# Patient Record
Sex: Male | Born: 1965 | Race: Black or African American | Hispanic: No | Marital: Married | Smoking: Current every day smoker
Health system: Southern US, Community
[De-identification: ages and names within clinical notes are randomized; demographics above are authoritative.]

---

## 2006-09-13 ENCOUNTER — Emergency Department (HOSPITAL_COMMUNITY): Admission: EM | Admit: 2006-09-13 | Discharge: 2006-09-13 | Payer: Self-pay | Admitting: Emergency Medicine

## 2007-04-16 ENCOUNTER — Emergency Department (HOSPITAL_COMMUNITY): Admission: EM | Admit: 2007-04-16 | Discharge: 2007-04-16 | Payer: Self-pay | Admitting: Emergency Medicine

## 2009-08-05 ENCOUNTER — Emergency Department (HOSPITAL_COMMUNITY): Admission: EM | Admit: 2009-08-05 | Discharge: 2009-08-05 | Payer: Self-pay | Admitting: Emergency Medicine

## 2009-08-10 ENCOUNTER — Emergency Department (HOSPITAL_COMMUNITY): Admission: EM | Admit: 2009-08-10 | Discharge: 2009-08-11 | Payer: Self-pay | Admitting: Emergency Medicine

## 2009-08-12 ENCOUNTER — Emergency Department (HOSPITAL_COMMUNITY): Admission: EM | Admit: 2009-08-12 | Discharge: 2009-08-12 | Payer: Self-pay | Admitting: Emergency Medicine

## 2010-09-28 LAB — COMPREHENSIVE METABOLIC PANEL
AST: 27 U/L (ref 0–37)
Albumin: 4.3 g/dL (ref 3.5–5.2)
Albumin: 4 g/dL (ref 3.5–5.2)
Alkaline Phosphatase: 59 U/L (ref 39–117)
Alkaline Phosphatase: 66 U/L (ref 39–117)
Calcium: 8.9 mg/dL (ref 8.4–10.5)
Creatinine, Ser: 1.19 mg/dL (ref 0.4–1.5)
GFR calc Af Amer: >60 mL/min (ref >60)
Glucose, Bld: 123 mg/dL — ABNORMAL HIGH (ref 70–99)
Glucose, Bld: 116 mg/dL — ABNORMAL HIGH (ref 70–99)
Potassium: 3.4 mEq/L — ABNORMAL LOW (ref 3.5–5.1)
Sodium: 136 mEq/L (ref 135–145)
Sodium: 133 mEq/L — ABNORMAL LOW (ref 135–145)
Total Bilirubin: 0.8 mg/dL (ref 0.3–1.2)
Total Protein: 8.4 g/dL — ABNORMAL HIGH (ref 6.0–8.3)

## 2010-09-28 LAB — CBC
HCT: 44.1 % (ref 39.0–52.0)
Hemoglobin: 14.7 g/dL (ref 13.0–17.0)
Hemoglobin: 14.5 g/dL (ref 13.0–17.0)
MCHC: 33.3 g/dL (ref 30.0–36.0)
MCHC: 32.8 g/dL (ref 30.0–36.0)
MCV: 86 fL (ref 78.0–100.0)
RBC: 5.13 MIL/uL (ref 4.22–5.81)
RBC: 5.15 MIL/uL (ref 4.22–5.81)
RDW: 13.1 % (ref 11.5–15.5)
RDW: 14.3 % (ref 11.5–15.5)
WBC: 18.6 10*3/uL — ABNORMAL HIGH (ref 4.0–10.5)

## 2010-09-28 LAB — DIFFERENTIAL
Basophils Absolute: 0.1 10*3/uL (ref 0.0–0.1)
Basophils Relative: 3 % — ABNORMAL HIGH (ref 0–1)
Basophils Relative: 1 % (ref 0–1)
Eosinophils Absolute: 0 10*3/uL (ref 0.0–0.7)
Eosinophils Absolute: 0.1 10*3/uL (ref 0.0–0.7)
Monocytes Absolute: 1.1 10*3/uL — ABNORMAL HIGH (ref 0.1–1.0)
Monocytes Absolute: 1.2 10*3/uL — ABNORMAL HIGH (ref 0.1–1.0)
Monocytes Relative: 6 % (ref 3–12)
Monocytes Relative: 6 % (ref 3–12)
Neutro Abs: 12.9 10*3/uL — ABNORMAL HIGH (ref 1.7–7.7)
Neutrophils Relative %: 70 % (ref 43–77)

## 2010-09-28 LAB — URINALYSIS, ROUTINE W REFLEX MICROSCOPIC
Leukocytes, UA: NEGATIVE
Nitrite: NEGATIVE
Protein, ur: 30 mg/dL — AB
Urobilinogen, UA: 1 mg/dL (ref 0.0–1.0)

## 2010-09-28 LAB — URINE MICROSCOPIC-ADD ON

## 2010-09-28 LAB — LIPASE, BLOOD: Lipase: 16 U/L (ref 11–59)

## 2010-10-01 LAB — URINALYSIS, ROUTINE W REFLEX MICROSCOPIC
Glucose, UA: NEGATIVE mg/dL
Hgb urine dipstick: NEGATIVE
Specific Gravity, Urine: 1.028 (ref 1.005–1.030)
Urobilinogen, UA: 1 mg/dL (ref 0.0–1.0)

## 2010-10-01 LAB — COMPREHENSIVE METABOLIC PANEL
ALT: 27 U/L (ref 0–53)
Alkaline Phosphatase: 59 U/L (ref 39–117)
BUN: 14 mg/dL (ref 6–23)
CO2: 21 mEq/L (ref 19–32)
GFR calc Af Amer: >60 mL/min (ref >60)
GFR calc non Af Amer: >60 mL/min (ref >60)
Glucose, Bld: 142 mg/dL — ABNORMAL HIGH (ref 70–99)
Potassium: 3.5 mEq/L (ref 3.5–5.1)
Sodium: 135 mEq/L (ref 135–145)
Total Bilirubin: 0.8 mg/dL (ref 0.3–1.2)
Total Protein: 7.7 g/dL (ref 6.0–8.3)

## 2010-10-01 LAB — CBC
HCT: 40.8 % (ref 39.0–52.0)
Platelets: 341 10*3/uL (ref 150–400)
RBC: 4.77 MIL/uL (ref 4.22–5.81)

## 2010-10-01 LAB — DIFFERENTIAL
Basophils Absolute: 0 10*3/uL (ref 0.0–0.1)
Eosinophils Relative: 1 % (ref 0–5)
Lymphocytes Relative: 21 % (ref 12–46)
Lymphs Abs: 3.2 10*3/uL (ref 0.7–4.0)
Neutro Abs: 10.8 10*3/uL — ABNORMAL HIGH (ref 1.7–7.7)
Neutrophils Relative %: 72 % (ref 43–77)

## 2010-10-01 LAB — LIPASE, BLOOD: Lipase: 19 U/L (ref 11–59)

## 2011-08-18 ENCOUNTER — Encounter (HOSPITAL_COMMUNITY): Payer: Self-pay | Admitting: *Deleted

## 2011-08-18 ENCOUNTER — Emergency Department (HOSPITAL_COMMUNITY)
Admission: EM | Admit: 2011-08-18 | Discharge: 2011-08-18 | Disposition: A | Discharge status: Home / Self Care | Payer: Self-pay | Attending: Emergency Medicine | Admitting: Emergency Medicine

## 2011-08-18 DIAGNOSIS — R209 Unspecified disturbances of skin sensation: Secondary | ICD-10-CM | POA: Insufficient documentation

## 2011-08-18 DIAGNOSIS — M79609 Pain in unspecified limb: Secondary | ICD-10-CM | POA: Insufficient documentation

## 2011-08-18 DIAGNOSIS — M5432 Sciatica, left side: Secondary | ICD-10-CM

## 2011-08-18 DIAGNOSIS — M5430 Sciatica, unspecified side: Secondary | ICD-10-CM | POA: Insufficient documentation

## 2011-08-18 MED ORDER — OXYCODONE-ACETAMINOPHEN 5-325 MG PO TABS
2.0000 | ORAL_TABLET | Freq: Once | ORAL | Status: AC
  Administered 2011-08-18: 2 via ORAL
  Filled 2011-08-18: qty 2

## 2011-08-18 MED ORDER — OXYCODONE-ACETAMINOPHEN 5-325 MG PO TABS
2.0000 | ORAL_TABLET | ORAL | Status: AC | PRN
Start: 2011-08-18 — End: 2011-08-28

## 2011-08-18 MED ORDER — PREDNISONE 20 MG PO TABS: ORAL_TABLET | ORAL | Status: AC

## 2011-08-18 MED ORDER — CYCLOBENZAPRINE HCL 5 MG PO TABS: 10.0000 mg | ORAL_TABLET | Freq: Three times a day (TID) | ORAL | Status: AC | PRN

## 2011-08-18 MED ORDER — PREDNISONE 20 MG PO TABS
60.0000 mg | ORAL_TABLET | Freq: Once | ORAL | Status: AC
  Administered 2011-08-18: 60 mg via ORAL
  Filled 2011-08-18: qty 3

## 2011-08-18 NOTE — ED Notes (Signed)
Pt in c/o right thigh pain x3 weeks, worse over last few days, unsure of injury

## 2011-08-18 NOTE — ED Provider Notes (Signed)
History     CSN: [id number]  Arrival date & time 08/18/11  1904   First MD Initiated Contact with Patient 08/18/11 1911      Chief Complaint  Patient presents with  . Leg Pain    (Consider location/radiation/quality/duration/timing/severity/associated sxs/prior treatment) HPI  46yo M c/o L thigh pain.  Pain has been wax and wane for the past few weeks.  Sts he initially was walking and felt a mild burning sensation to back of L thigh.  Pain then resolved on its own.  However, for the past 3 days he notice significant pain to L thigh.  Pain worsen with leg movement, and improves with laying in a certain position.  He denies specific injury, fever, rash, back pain, knee pain.  Denies urinary or bowel incontinence, caudal equina sxs, or pain radiates past knee.  Denies hx of diabetes or IV drug use.    History reviewed. No pertinent past medical history.  History reviewed. No pertinent past surgical history.  History reviewed. No pertinent family history.  History  Substance Use Topics  . Smoking status: Not on file  . Smokeless tobacco: Not on file  . Alcohol Use: Not on file      Review of Systems  All other systems reviewed and are negative.    Allergies  Review of patient's allergies indicates no known allergies.  Home Medications  No current outpatient prescriptions on file.  BP 159/83  Pulse 101  Temp(Src) 98 F (36.7 C) (Oral)  Resp 20  SpO2 100%  Physical Exam  Nursing note and vitals reviewed. Constitutional: He appears well-developed and well-nourished. No distress.  HENT:  Head: Atraumatic.  Eyes: Conjunctivae are normal.  Neck: Neck supple.  Cardiovascular: Normal rate and regular rhythm.   Pulmonary/Chest: Effort normal. No respiratory distress. He exhibits no tenderness.  Abdominal: Soft.  Musculoskeletal:       Right hip: Normal.       Left hip: He exhibits decreased range of motion and decreased strength. He exhibits no tenderness and no  bony tenderness.       Right knee: Normal.       Left knee: Normal.       Cervical back: Normal.       Thoracic back: Normal.       Lumbar back: Normal.       L thigh, increase pain with flexion, leg raise, or leg extension.  Pain to posterior aspect of thigh.  No overlying skin changes, nor swelling noted  Patella DTR 2+ bilaterally.      ED Course  Procedures (including critical care time)  Labs Reviewed - No data to display No results found.   No diagnosis found.    MDM  Pain to L posterior thigh.  PERC negative, doubt DVT, likely L sciatica.  No complaints concerning for spinal nerve impingement at this time.  Will d/c with percocet/flexeril/prednisone and f/u instruction with ortho.  Pt voice understanding.          Fayrene Helper, PA-C 08/18/11 1947

## 2011-08-18 NOTE — ED Notes (Signed)
Pt is c/o pain in the back of his left thigh  States it started a few weeks ago and has progressively gotten worse  States pain has been severe since Monday  Denies injury

## 2011-08-18 NOTE — ED Provider Notes (Signed)
Medical screening examination/treatment/procedure(s) were performed by non-physician practitioner and as supervising physician I was immediately available for consultation/collaboration.   Dayton Bailiff, MD 08/18/11 2017

## 2017-09-07 ENCOUNTER — Encounter (HOSPITAL_COMMUNITY): Payer: Self-pay

## 2017-09-07 ENCOUNTER — Emergency Department (HOSPITAL_COMMUNITY)
Admission: EM | Admit: 2017-09-07 | Discharge: 2017-09-07 | Disposition: A | Discharge status: Home / Self Care | Payer: Self-pay | Attending: Emergency Medicine | Admitting: Emergency Medicine

## 2017-09-07 DIAGNOSIS — X500XXA Overexertion from strenuous movement or load, initial encounter: Secondary | ICD-10-CM | POA: Insufficient documentation

## 2017-09-07 DIAGNOSIS — M5416 Radiculopathy, lumbar region: Secondary | ICD-10-CM | POA: Insufficient documentation

## 2017-09-07 DIAGNOSIS — Y999 Unspecified external cause status: Secondary | ICD-10-CM | POA: Insufficient documentation

## 2017-09-07 DIAGNOSIS — Y93H9 Activity, other involving exterior property and land maintenance, building and construction: Secondary | ICD-10-CM | POA: Insufficient documentation

## 2017-09-07 DIAGNOSIS — Y9289 Other specified places as the place of occurrence of the external cause: Secondary | ICD-10-CM | POA: Insufficient documentation

## 2017-09-07 MED ORDER — KETOROLAC TROMETHAMINE 30 MG/ML IJ SOLN
30.0000 mg | Freq: Once | INTRAMUSCULAR | Status: AC
  Administered 2017-09-07: 30 mg via INTRAMUSCULAR
  Filled 2017-09-07: qty 1

## 2017-09-07 MED ORDER — CYCLOBENZAPRINE HCL 10 MG PO TABS: 10.0000 mg | ORAL_TABLET | Freq: Two times a day (BID) | ORAL | 0 refills | Status: AC | PRN

## 2017-09-07 MED ORDER — HYDROCODONE-ACETAMINOPHEN 5-325 MG PO TABS
2.0000 | ORAL_TABLET | Freq: Once | ORAL | Status: AC
  Administered 2017-09-07: 2 via ORAL
  Filled 2017-09-07: qty 2

## 2017-09-07 MED ORDER — PREDNISONE 20 MG PO TABS: 40.0000 mg | ORAL_TABLET | Freq: Every day | ORAL | 0 refills | Status: AC

## 2017-09-07 MED ORDER — DIAZEPAM 5 MG/ML IJ SOLN
5.0000 mg | Freq: Once | INTRAMUSCULAR | Status: AC
  Administered 2017-09-07: 5 mg via INTRAMUSCULAR
  Filled 2017-09-07: qty 2

## 2017-09-07 MED ORDER — TRAMADOL HCL 50 MG PO TABS: 50.0000 mg | ORAL_TABLET | Freq: Four times a day (QID) | ORAL | 0 refills | Status: AC | PRN

## 2017-09-07 NOTE — ED Provider Notes (Signed)
[facility]-EMERGENCY DEPT Provider Note   CSN: [id number] Arrival date & time: 09/07/17  0303     History   Chief Complaint Chief Complaint  Patient presents with  . Leg Pain    HPI Ronnie White is a 52 y.o. male.  Patient presents with a chief complaint of right leg pain.  He states that he has been cutting down a large tree and lifting heavy logs.  He reports having low back pain that he associates with his right leg pain.  He states the pain radiates from the low back to the lower leg he also feels the pain in his right buttock and thigh.  He states that this feels like sciatica.  He has tried using an inversion table with no relief.  He also reports having taken OTC medications with no relief.  He rates his pain is severe.  He denies any bowel or bladder incontinence.  Denies any saddle anesthesia.  Denies numbness, weakness, or paralysis of his lower extremities.   The history is provided by the patient. No language interpreter was used.    History reviewed. No pertinent past medical history.  There are no active problems to display for this patient.   History reviewed. No pertinent surgical history.     Home Medications    Prior to Admission medications   Medication Sig Start Date End Date Taking? Authorizing Provider  ibuprofen (ADVIL,MOTRIN) 200 MG tablet Take 800 mg by mouth every 6 (six) hours as needed for moderate pain.   Yes [provider]  Menthol-Methyl Salicylate (ICY HOT) 10-30 % STCK Apply 1 application topically every 8 (eight) hours as needed (pain).   Yes [provider]  naproxen sodium (ALEVE) 220 MG tablet Take 220 mg by mouth 2 (two) times daily as needed (pain).   Yes [provider]  cyclobenzaprine (FLEXERIL) 10 MG tablet Take 1 tablet (10 mg total) by mouth 2 (two) times daily as needed for muscle spasms. 09/07/17   Roxy HorsemanBrowning, Marcellus Pulliam, PA-C  predniSONE (DELTASONE) 20 MG tablet Take 2 tablets (40  mg total) by mouth daily. Take 40 mg by mouth daily for 3 days, then 20mg  by mouth daily for 3 days, then 10mg  daily for 3 days 09/07/17   Roxy HorsemanBrowning, Lorien Shingler, PA-C  traMADol (ULTRAM) 50 MG tablet Take 1 tablet (50 mg total) by mouth every 6 (six) hours as needed. 09/07/17   Roxy HorsemanBrowning, Kiley Solimine, PA-C    Family History History reviewed. No pertinent family history.  Social History Social History   Tobacco Use  . Smoking status: Never Smoker  . Smokeless tobacco: Never Used  Substance Use Topics  . Alcohol use: No    Frequency: Never  . Drug use: No     Allergies   Patient has no known allergies.   Review of Systems Review of Systems  All other systems reviewed and are negative.    Physical Exam Updated Vital Signs BP (!) 179/70 (BP Location: Right Arm)   Pulse 83   Temp 97.8 F (36.6 C) (Oral)   Resp 20   SpO2 100%   Physical Exam Physical Exam  Constitutional: Pt appears well-developed and well-nourished. No distress.  HENT:  Head: Normocephalic and atraumatic.  Mouth/Throat: Oropharynx is clear and moist. No oropharyngeal exudate.  Eyes: Conjunctivae are normal.  Neck: Normal range of motion. Neck supple.  No meningismus Cardiovascular: Normal rate, regular rhythm and intact distal pulses.   Pulmonary/Chest: Effort normal and breath sounds normal. No  respiratory distress. Pt has no wheezes.  Abdominal: Pt exhibits no distension Musculoskeletal:   Right lumbar paraspinal muscles tender to palpation, no bony CTLS spine tenderness, deformity, step-off, or crepitus Lymphadenopathy: Pt has no cervical adenopathy.  Neurological: Pt is alert and oriented Speech is clear and goal oriented, follows commands Normal 5/5 strength in upper and lower extremities bilaterally including dorsiflexion and plantar flexion Sensation intact Great toe extension intact Moves extremities without ataxia, coordination intact Antalgic gait Normal balance  Skin: Skin is warm and dry. No  rash noted. Pt is not diaphoretic. No erythema.  Psychiatric: Pt has a normal mood and affect. Behavior is normal.  Nursing note and vitals reviewed.   ED Treatments / Results  Labs (all labs ordered are listed, but only abnormal results are displayed) Labs Reviewed - No data to display  EKG  EKG Interpretation None       Radiology No results found.  Procedures Procedures (including critical care time)  Medications Ordered in ED Medications  ketorolac (TORADOL) 30 MG/ML injection 30 mg (30 mg Intramuscular Given 09/07/17 0516)  diazepam (VALIUM) injection 5 mg (5 mg Intramuscular Given 09/07/17 0516)  HYDROcodone-acetaminophen (NORCO/VICODIN) 5-325 MG per tablet 2 tablet (2 tablets Oral Given 09/07/17 0515)     Initial Impression / Assessment and Plan / ED Course  I have reviewed the triage vital signs and the nursing notes.  Pertinent labs & imaging results that were available during my care of the patient were reviewed by me and considered in my medical decision making (see chart for details).    Patient with back pain.    No neurological deficits and normal neuro exam.  Patient is ambulatory.  No loss of bowel or bladder control.  Doubt cauda equina.  Denies fever,  doubt epidural abscess or other lesion. Recommend back exercises, stretching, RICE, and will treat with a short course of prednisone, ultram, and flexeril.  Review of prior charts show hx of left sided sciatica.  Encouraged the patient that there could be a need for additional workup and/or imaging such as MRI, if the symptoms do not resolve. Patient advised that if the back pain does not resolve, or radiates, this could progress to more serious conditions and is encouraged to follow-up with PCP or orthopedics within 2 weeks.     Final Clinical Impressions(s) / ED Diagnoses   Final diagnoses:  Lumbar radiculopathy    ED Discharge Orders        Ordered    predniSONE (DELTASONE) 20 MG tablet  Daily      09/07/17 0527    traMADol (ULTRAM) 50 MG tablet  Every 6 hours PRN     09/07/17 0527    cyclobenzaprine (FLEXERIL) 10 MG tablet  2 times daily PRN     09/07/17 0527       Roxy Horseman, PA-C 09/07/17 9562    Zadie Rhine, MD 09/07/17 [phone]

## 2017-09-07 NOTE — ED Triage Notes (Signed)
Pt complains of right leg pain more severe tonight A few months ago he had a back injury and has been seen multiple times and places for that He states he's unable to bear weight on the right leg

## 2017-10-16 ENCOUNTER — Other Ambulatory Visit: Payer: Self-pay

## 2017-10-16 ENCOUNTER — Emergency Department (HOSPITAL_COMMUNITY): Payer: Self-pay

## 2017-10-16 ENCOUNTER — Encounter (HOSPITAL_COMMUNITY): Payer: Self-pay

## 2017-10-16 ENCOUNTER — Emergency Department (HOSPITAL_COMMUNITY)
Admission: EM | Admit: 2017-10-16 | Discharge: 2017-10-16 | Disposition: A | Discharge status: Home / Self Care | Payer: Self-pay | Attending: Emergency Medicine | Admitting: Emergency Medicine

## 2017-10-16 DIAGNOSIS — J039 Acute tonsillitis, unspecified: Secondary | ICD-10-CM | POA: Insufficient documentation

## 2017-10-16 LAB — BASIC METABOLIC PANEL
ANION GAP: 10 (ref 5–15)
BUN: 10 mg/dL (ref 6–20)
CALCIUM: 9.5 mg/dL (ref 8.9–10.3)
CHLORIDE: 103 mmol/L (ref 101–111)
CO2: 25 mmol/L (ref 22–32)
Creatinine, Ser: 0.89 mg/dL (ref 0.61–1.24)
GFR calc non Af Amer: >60 mL/min (ref >60)
Glucose, Bld: 140 mg/dL — ABNORMAL HIGH (ref 65–99)
Potassium: 3.6 mmol/L (ref 3.5–5.1)
Sodium: 138 mmol/L (ref 135–145)

## 2017-10-16 LAB — CBC
HEMATOCRIT: 37.8 % — AB (ref 39.0–52.0)
HEMOGLOBIN: 12.5 g/dL — AB (ref 13.0–17.0)
MCH: 28.3 pg (ref 26.0–34.0)
MCHC: 33.1 g/dL (ref 30.0–36.0)
MCV: 85.5 fL (ref 78.0–100.0)
Platelets: 408 10*3/uL — ABNORMAL HIGH (ref 150–400)
RBC: 4.42 MIL/uL (ref 4.22–5.81)
RDW: 13.8 % (ref 11.5–15.5)
WBC: 25.6 10*3/uL — AB (ref 4.0–10.5)

## 2017-10-16 MED ORDER — IOHEXOL 300 MG/ML  SOLN
75.0000 mL | Freq: Once | INTRAMUSCULAR | Status: AC | PRN
Start: 2017-10-16 — End: 2017-10-16
  Administered 2017-10-16: 75 mL via INTRAVENOUS

## 2017-10-16 MED ORDER — CLINDAMYCIN HCL 300 MG PO CAPS: 300.0000 mg | ORAL_CAPSULE | Freq: Three times a day (TID) | ORAL | 0 refills | Status: DC

## 2017-10-16 MED ORDER — CLINDAMYCIN PHOSPHATE 900 MG/50ML IV SOLN
900.0000 mg | Freq: Once | INTRAVENOUS | Status: AC
  Administered 2017-10-16: 900 mg via INTRAVENOUS
  Filled 2017-10-16: qty 50

## 2017-10-16 MED ORDER — SODIUM CHLORIDE 0.9 % IJ SOLN
INTRAMUSCULAR | Status: AC
  Filled 2017-10-16: qty 50

## 2017-10-16 MED ORDER — KETOROLAC TROMETHAMINE 30 MG/ML IJ SOLN
30.0000 mg | Freq: Once | INTRAMUSCULAR | Status: AC
  Administered 2017-10-16: 30 mg via INTRAVENOUS
  Filled 2017-10-16: qty 1

## 2017-10-16 MED ORDER — HYDROMORPHONE HCL 1 MG/ML IJ SOLN
1.0000 mg | Freq: Once | INTRAMUSCULAR | Status: AC
  Administered 2017-10-16: 1 mg via INTRAVENOUS
  Filled 2017-10-16: qty 1

## 2017-10-16 MED ORDER — DEXAMETHASONE SODIUM PHOSPHATE 10 MG/ML IJ SOLN
10.0000 mg | Freq: Once | INTRAMUSCULAR | Status: AC
  Administered 2017-10-16: 10 mg via INTRAVENOUS
  Filled 2017-10-16: qty 1

## 2017-10-16 MED ORDER — SODIUM CHLORIDE 0.9 % IV BOLUS
1000.0000 mL | Freq: Once | INTRAVENOUS | Status: AC
  Administered 2017-10-16: 1000 mL via INTRAVENOUS

## 2017-10-16 NOTE — ED Provider Notes (Signed)
[facility]-EMERGENCY DEPT Provider Note   CSN: [id number] Arrival date & time: 10/16/17  0539     History   Chief Complaint Chief Complaint  Patient presents with  . Sore Throat    HPI Ronnie White is a 52 y.o. male.  HPI 52 year old male presents the emergency department 48 hours of ongoing sore throat.  No difficulty breathing or swallowing.  Some painful swallowing.  Mild swelling noted at his left jaw and some increased tenderness on the left side.  Reports possible fever last night.  No recent sick contacts.  No cough or congestion.  No other complaints.  Symptoms are moderate in severity.   History reviewed. No pertinent past medical history.  There are no active problems to display for this patient.   History reviewed. No pertinent surgical history.      Home Medications    Prior to Admission medications   Medication Sig Start Date End Date Taking? Authorizing Provider  ibuprofen (ADVIL,MOTRIN) 200 MG tablet Take 800 mg by mouth every 6 (six) hours as needed for moderate pain.   Yes [provider]  clindamycin (CLEOCIN) 300 MG capsule Take 1 capsule (300 mg total) by mouth 3 (three) times daily. 10/16/17   Azalia Bilis, MD  cyclobenzaprine (FLEXERIL) 10 MG tablet Take 1 tablet (10 mg total) by mouth 2 (two) times daily as needed for muscle spasms. Patient not taking: Reported on 10/16/2017 09/07/17   Roxy Horseman, PA-C  predniSONE (DELTASONE) 20 MG tablet Take 2 tablets (40 mg total) by mouth daily. Take 40 mg by mouth daily for 3 days, then  by mouth daily for 3 days, then  daily for 3 days Patient not taking: Reported on 10/16/2017 09/07/17   Roxy Horseman, PA-C  traMADol (ULTRAM) 50 MG tablet Take 1 tablet (50 mg total) by mouth every 6 (six) hours as needed. Patient not taking: Reported on 10/16/2017 09/07/17   Roxy Horseman, PA-C    Family History History reviewed. No pertinent family history.  Social  History Social History   Tobacco Use  . Smoking status: Never Smoker  . Smokeless tobacco: Never Used  Substance Use Topics  . Alcohol use: No    Frequency: Never  . Drug use: No     Allergies   Patient has no known allergies.   Review of Systems Review of Systems  All other systems reviewed and are negative.    Physical Exam Updated Vital Signs BP (!) 151/94   Pulse 91   Temp 98.7 F (37.1 C) (Oral)   Resp 16   Ht  (1.727 m)   Wt 83.9 kg (185 lb)   SpO2 96%   BMI 28.13 kg/m   Physical Exam  Constitutional: He is oriented to person, place, and time. He appears well-developed and well-nourished.  HENT:  Head: Normocephalic.  Uvula is midline.  Mild prominence of the left anterior tonsillar pillar without obvious fluctuance.  Bilateral tonsillar swelling and mild exudates.  Oral airway is patent.  Tolerating secretions.  Dentition without significant abnormality.  Tongue is normal in size.  Space under his tongue is soft.  Anterior neck is normal with mild cervical lymphadenopathy on the left  Eyes: EOM are normal.  Neck: Normal range of motion.  Pulmonary/Chest: Effort normal.  Abdominal: He exhibits no distension.  Musculoskeletal: Normal range of motion.  Neurological: He is alert and oriented to person, place, and time.  Psychiatric: He has a normal mood and affect.  Nursing  note and vitals reviewed.    ED Treatments / Results  Labs (all labs ordered are listed, but only abnormal results are displayed) Labs Reviewed  CBC - Abnormal; Notable for the following components:      Result Value   WBC 25.6 (*)    Hemoglobin 12.5 (*)    HCT 37.8 (*)    Platelets 408 (*)    All other components within normal limits  BASIC METABOLIC PANEL - Abnormal; Notable for the following components:   Glucose, Bld 140 (*)    All other components within normal limits    EKG None  Radiology Ct Soft Tissue Neck W Contrast  Result Date: 10/16/2017 CLINICAL DATA:   Sore throat and fever.  Stridor. EXAM: CT NECK WITH CONTRAST TECHNIQUE: Multidetector CT imaging of the neck was performed using the standard protocol following the bolus administration of intravenous contrast. CONTRAST:  75mL OMNIPAQUE IOHEXOL 300 MG/ML  SOLN COMPARISON:  None. FINDINGS: Pharynx and larynx: Marked enlargement of the pharyngeal tonsils bilaterally, left greater than right. Low-density edema in the left tonsil without well-defined abscess. Adenoid and soft palate enlargement also bilaterally. Narrowing of the nasopharynx and oropharynx due to soft tissue swelling. Epiglottis normal. Larynx normal. Salivary glands: No inflammation, mass, or stone. Thyroid: Negative Lymph nodes: Enlarged lymph nodes in the neck bilaterally. Largest node on the right is 13 mm level 2 region. Left level 2 node 9 mm. Multiple small posterior nodes bilaterally. These are likely reactive due to pharyngitis. Vascular: Normal arterial and venous enhancement. Limited intracranial: Negative Visualized orbits: Negative Mastoids and visualized paranasal sinuses: Moderate mucosal edema paranasal sinuses without air-fluid level. Mastoid sinus clear bilaterally. Skeleton: Poor dentition with numerous caries. No acute skeletal lesion. Upper chest: Negative Other: None IMPRESSION: Marked enlargement of the tonsils bilaterally left greater than right. Soft tissue swelling of the soft palate and adenoid. Findings most compatible with severe pharyngitis without abscess. Nasopharyngeal airway narrow. Oropharyngeal airway mildly narrowed. Negative for epiglottitis. Cervical enlarged lymph nodes right greater than left most likely related to pharyngitis. Electronically Signed   By: Marlan Palau M.D.   On: 10/16/2017 09:54    Procedures Procedures (including critical care time)  Medications Ordered in ED Medications  sodium chloride 0.9 % injection (has no administration in time range)  ketorolac (TORADOL) 30 MG/ML injection 30 mg  (30 mg Intravenous Given 10/16/17 0818)  dexamethasone (DECADRON) injection 10 mg (10 mg Intravenous Given 10/16/17 0818)  HYDROmorphone (DILAUDID) injection 1 mg (1 mg Intravenous Given 10/16/17 0818)  sodium chloride 0.9 % bolus 1,000 mL (0 mLs Intravenous Stopped 10/16/17 0903)  clindamycin (CLEOCIN) IVPB 900 mg (0 mg Intravenous Stopped 10/16/17 0903)  iohexol (OMNIPAQUE) 300 MG/ML solution 75 mL (75 mLs Intravenous Contrast Given 10/16/17 0932)     Initial Impression / Assessment and Plan / ED Course  I have reviewed the triage vital signs and the nursing notes.  Pertinent labs & imaging results that were available during my care of the patient were reviewed by me and considered in my medical decision making (see chart for details).     Likely peritonsillar phlegmon on the left.  No discrete abscess noted on CT imaging.  Uvula is still midline.  Tolerating secretions.  Feels much better after treatment here in the emergency department.  Home with clindamycin and instructions to return to the ER for new or worsening symptoms.  Discussion of peritonsillar abscess was had with the patient and he understands the possibility of worsening symptoms despite  appropriate antibiotics.  Final Clinical Impressions(s) / ED Diagnoses   Final diagnoses:  Acute tonsillitis, unspecified etiology    ED Discharge Orders        Ordered    clindamycin (CLEOCIN) 300 MG capsule  3 times daily     10/16/17 1042       Azalia Bilis, MD 10/16/17 1051

## 2017-10-16 NOTE — ED Notes (Signed)
Patient transported to CT 

## 2017-10-16 NOTE — ED Triage Notes (Signed)
States sore throat x 2 days no drooling noted muffled voice noted pain 10/10.

## 2017-10-29 ENCOUNTER — Other Ambulatory Visit: Payer: Self-pay

## 2017-10-29 ENCOUNTER — Emergency Department (HOSPITAL_COMMUNITY)
Admission: EM | Admit: 2017-10-29 | Discharge: 2017-10-29 | Disposition: A | Discharge status: Home / Self Care | Payer: Self-pay | Attending: Emergency Medicine | Admitting: Emergency Medicine

## 2017-10-29 ENCOUNTER — Encounter (HOSPITAL_COMMUNITY): Payer: Self-pay | Admitting: Emergency Medicine

## 2017-10-29 DIAGNOSIS — J020 Streptococcal pharyngitis: Secondary | ICD-10-CM | POA: Insufficient documentation

## 2017-10-29 LAB — GROUP A STREP BY PCR: Group A Strep by PCR: DETECTED — AB

## 2017-10-29 LAB — MONONUCLEOSIS SCREEN: Mono Screen: NEGATIVE

## 2017-10-29 MED ORDER — KETOROLAC TROMETHAMINE 15 MG/ML IJ SOLN
15.0000 mg | Freq: Once | INTRAMUSCULAR | Status: AC
  Administered 2017-10-29: 15 mg via INTRAVENOUS
  Filled 2017-10-29: qty 1

## 2017-10-29 MED ORDER — CLINDAMYCIN PHOSPHATE 600 MG/50ML IV SOLN
600.0000 mg | Freq: Once | INTRAVENOUS | Status: AC
  Administered 2017-10-29: 600 mg via INTRAVENOUS
  Filled 2017-10-29: qty 50

## 2017-10-29 MED ORDER — DEXAMETHASONE SODIUM PHOSPHATE 10 MG/ML IJ SOLN
10.0000 mg | Freq: Once | INTRAMUSCULAR | Status: AC
  Administered 2017-10-29: 10 mg via INTRAVENOUS
  Filled 2017-10-29: qty 1

## 2017-10-29 MED ORDER — CLINDAMYCIN HCL 300 MG PO CAPS
300.0000 mg | ORAL_CAPSULE | Freq: Three times a day (TID) | ORAL | 0 refills | Status: DC
Start: 2017-10-29 — End: 2022-08-21

## 2017-10-29 MED ORDER — LIDOCAINE VISCOUS 2 % MT SOLN: 20.0000 mL | OROMUCOSAL | 0 refills | Status: AC | PRN

## 2017-10-29 NOTE — ED Triage Notes (Addendum)
Patient comes in complaining of sore throat. Seen here recently for same. Patient was diagnosed with strep. Patient states he took meds and got better. Yesterday, patient states that pain and swelling came back with a quick onset. Patient voice is noted to be muffled. Patient able to swallow without distress or difficulty in triage.

## 2017-10-29 NOTE — Discharge Instructions (Signed)
Please take antibiotics as prescribed.  Have given you viscous lidocaine to help numb your throat.  Would recommend taking Motrin and Tylenol to help with the pain.  If you develop worsening swelling return to the ED or follow-up with the ENT doctor.  Discard her toothbrush after 24-48 hours.

## 2017-10-29 NOTE — ED Provider Notes (Signed)
[facility]-EMERGENCY DEPT Provider Note   CSN: [id number] Arrival date & time: 10/29/17  0129     History   Chief Complaint Chief Complaint  Patient presents with  . Sore Throat    HPI Ronnie White is a 52 y.o. male.  HPI 52 year old African-American male sent to the ED with acute onset of sore throat yesterday.  Patient reports that his bilateral with left than right.  Patient also notes a slightly muffled voice.  Denies any difficulties breathing or swallowing.  Patient states that he was seen approximately 3 weeks ago in the ED for same.  At that time CT scan was obtained to rule out peritonsillar abscess and noted phlegmon but no acute abscess.  Patient was given clindamycin and states that he improved.  Patient states that his symptoms returned yesterday.  They are not as bad as they were before.  Denies any associated fevers or cough.  Does report some rhinorrhea and nasal congestion.  Denies any neck pain.  He is not take anything for his symptoms prior to arrival.  Nothing makes better or worse. History reviewed. No pertinent past medical history.  There are no active problems to display for this patient.   History reviewed. No pertinent surgical history.      Home Medications    Prior to Admission medications   Medication Sig Start Date End Date Taking? Authorizing Provider  clindamycin (CLEOCIN) 300 MG capsule Take 1 capsule (300 mg total) by mouth 3 (three) times daily. 10/16/17   Azalia Bilisampos, Kevin, MD  cyclobenzaprine (FLEXERIL) 10 MG tablet Take 1 tablet (10 mg total) by mouth 2 (two) times daily as needed for muscle spasms. Patient not taking: Reported on 10/16/2017 09/07/17   Roxy HorsemanBrowning, Robert, PA-C  ibuprofen (ADVIL,MOTRIN) 200 MG tablet Take 800 mg by mouth every 6 (six) hours as needed for moderate pain.    [provider]  predniSONE (DELTASONE) 20 MG tablet Take 2 tablets (40 mg total) by mouth daily. Take 40 mg by mouth daily for 3  days, then 20mg  by mouth daily for 3 days, then 10mg  daily for 3 days Patient not taking: Reported on 10/16/2017 09/07/17   Roxy HorsemanBrowning, Robert, PA-C  traMADol (ULTRAM) 50 MG tablet Take 1 tablet (50 mg total) by mouth every 6 (six) hours as needed. Patient not taking: Reported on 10/16/2017 09/07/17   Roxy HorsemanBrowning, Robert, PA-C    Family History History reviewed. No pertinent family history.  Social History Social History   Tobacco Use  . Smoking status: Never Smoker  . Smokeless tobacco: Never Used  Substance Use Topics  . Alcohol use: No    Frequency: Never  . Drug use: No     Allergies   Patient has no known allergies.   Review of Systems Review of Systems  All other systems reviewed and are negative.    Physical Exam Updated Vital Signs BP (!) 145/81 (BP Location: Left Arm)   Pulse 98   Temp 98.3 F (36.8 C) (Oral)   Resp 16   Ht 5\' 8"  (1.727 m)   Wt 90.7 kg (200 lb)   SpO2 98%   BMI 30.41 kg/m   Physical Exam Constitutional: He is oriented to person, place, and time. He appears well-developed and well-nourished.  HENT:  Head: Normocephalic.  Uvula is midline.  Mild prominence of the left anterior tonsillar pillar without obvious fluctuance.  Bilateral tonsillar swelling and mild exudates.  Oral airway is patent.  Tolerating secretions.  Dentition  without significant abnormality.  Tongue is normal in size.  Space under his tongue is soft.  Anterior neck is normal with mild cervical lymphadenopathy on the left. Voice is slightly muffled.  Eyes: EOM are normal.  Neck: Normal range of motion.  Pulmonary/Chest: Effort normal.  Abdominal: He exhibits no distension.  Musculoskeletal: Normal range of motion.  Neurological: He is alert and oriented to person, place, and time.  Psychiatric: He has a normal mood and affect.  Nursing note and vitals reviewed.    ED Treatments / Results  Labs (all labs ordered are listed, but only abnormal results are displayed) Labs  Reviewed  GROUP A STREP BY PCR - Abnormal; Notable for the following components:      Result Value   Group A Strep by PCR DETECTED (*)    All other components within normal limits  MONONUCLEOSIS SCREEN    EKG None  Radiology No results found.  Procedures Procedures (including critical care time)  Medications Ordered in ED Medications  clindamycin (CLEOCIN) IVPB 600 mg (600 mg Intravenous New Bag/Given 10/29/17 0726)  dexamethasone (DECADRON) injection 10 mg (10 mg Intravenous Given 10/29/17 0549)  ketorolac (TORADOL) 15 MG/ML injection 15 mg (15 mg Intravenous Given 10/29/17 0549)     Initial Impression / Assessment and Plan / ED Course  I have reviewed the triage vital signs and the nursing notes.  Pertinent labs & imaging results that were available during my care of the patient were reviewed by me and considered in my medical decision making (see chart for details).     Patient presents to the emergency department today with complaints of sore throat.  Recently diagnosed with strep with a CT scan that did not show peritonsillar abscess several weeks ago.  Patient improved with clindamycin until symptoms returned yesterday.  Patient reports able to tolerate p.o. fluids and secretions.  Denies difficulties breathing or swallowing.  Patient's vital signs are reassuring.  He is overall well-appearing and nontoxic.  Patient is afebrile.  On exam patient is managing secretions and tolerating his airway.  Uvula is midline.  Does have bilateral cervical lymphadenopathy and tonsillar swelling.  Patient with a slightly muffled voice.  No neck stiffness on exam.  Patient's rapid strep is positive.  Negative mono spot.  She is treated with Toradol, Decadron and clindamycin in the ED.  Will discharge home on oral clindamycin.  Patient tolerating secretions and p.o. fluids.  Uvula is midline.  Low suspicion for peritonsillar abscess at this time.  Do not feel the patient needs re-imaging.   Did review patient's imaging from 2-3 weeks ago.  Showed left-sided phlegmon but no drainable abscess.  Patient will need follow-up with ENT if symptoms persist.  Encourage patient to discard his toothbrush as this is likely where he reinfected himself.  Pt is hemodynamically stable, in NAD, & able to ambulate in the ED. Evaluation does not show pathology that would require ongoing emergent intervention or inpatient treatment. I explained the diagnosis to the patient. Pain has been managed & has no complaints prior to dc. Pt is comfortable with above plan and is stable for discharge at this time. All questions were answered prior to disposition. Strict return precautions for f/u to the ED were discussed. Encouraged follow up with PCP.     Final Clinical Impressions(s) / ED Diagnoses   Final diagnoses:  Strep pharyngitis    ED Discharge Orders        Ordered    clindamycin (CLEOCIN) 300 MG  capsule  3 times daily     10/29/17 0738    lidocaine (XYLOCAINE) 2 % solution  As needed     10/29/17 0738       Rise Mu, PA-C 10/29/17 4098    Derwood Kaplan, MD 11/01/17 [phone]

## 2018-03-29 ENCOUNTER — Other Ambulatory Visit: Payer: Self-pay

## 2018-03-29 ENCOUNTER — Encounter (HOSPITAL_COMMUNITY): Payer: Self-pay

## 2018-03-29 DIAGNOSIS — Z79899 Other long term (current) drug therapy: Secondary | ICD-10-CM | POA: Insufficient documentation

## 2018-03-29 DIAGNOSIS — J020 Streptococcal pharyngitis: Secondary | ICD-10-CM | POA: Insufficient documentation

## 2018-03-29 NOTE — ED Triage Notes (Signed)
Pt reports sore throat and generalized body aches since yesterday. Denies chills, fever,vomiting or diarrhea. Endorses bilateral lymphadenopathy. A&Ox4. Ambulatory.

## 2018-03-30 ENCOUNTER — Emergency Department (HOSPITAL_COMMUNITY)
Admission: EM | Admit: 2018-03-30 | Discharge: 2018-03-30 | Disposition: A | Discharge status: Home / Self Care | Payer: Self-pay | Attending: Emergency Medicine | Admitting: Emergency Medicine

## 2018-03-30 DIAGNOSIS — J020 Streptococcal pharyngitis: Secondary | ICD-10-CM

## 2018-03-30 LAB — GROUP A STREP BY PCR: Group A Strep by PCR: DETECTED — AB

## 2018-03-30 MED ORDER — AMOXICILLIN 500 MG PO CAPS
500.0000 mg | ORAL_CAPSULE | Freq: Once | ORAL | Status: AC
  Administered 2018-03-30: 500 mg via ORAL
  Filled 2018-03-30: qty 1

## 2018-03-30 MED ORDER — AMOXICILLIN 500 MG PO CAPS: 500.0000 mg | ORAL_CAPSULE | Freq: Two times a day (BID) | ORAL | 0 refills | Status: AC

## 2018-03-30 MED ORDER — DEXAMETHASONE SODIUM PHOSPHATE 10 MG/ML IJ SOLN
10.0000 mg | Freq: Once | INTRAMUSCULAR | Status: AC
  Administered 2018-03-30: 10 mg via INTRAMUSCULAR
  Filled 2018-03-30: qty 1

## 2018-03-30 MED ORDER — IBUPROFEN 800 MG PO TABS: 800.0000 mg | ORAL_TABLET | Freq: Three times a day (TID) | ORAL | 0 refills | Status: AC | PRN

## 2018-03-30 MED ORDER — KETOROLAC TROMETHAMINE 30 MG/ML IJ SOLN
15.0000 mg | Freq: Once | INTRAMUSCULAR | Status: AC
  Administered 2018-03-30: 15 mg via INTRAMUSCULAR
  Filled 2018-03-30: qty 1

## 2018-03-30 MED ORDER — LIDOCAINE VISCOUS HCL 2 % MT SOLN
15.0000 mL | Freq: Once | OROMUCOSAL | Status: AC
  Administered 2018-03-30: 15 mL via OROMUCOSAL
  Filled 2018-03-30: qty 15

## 2018-03-30 NOTE — ED Provider Notes (Signed)
[facility]-EMERGENCY DEPT Provider Note   CSN: [id number] Arrival date & time: 03/29/18  2301     History   Chief Complaint Chief Complaint  Patient presents with  . Sore Throat  . Generalized Body Aches    HPI Ronnie White is a [age] y.o. male.  The history is provided by the patient and medical records. No language interpreter was used.  Sore Throat    Ronnie White is a 52 y.o. male  who presents to the Emergency Department complaining of persistent sore throat and generalized body aches which began yesterday.  He also reports difficulty with swallowing.  He is able to swallow, although it is very painful.  Denies any known fever, chills, abdominal pain, nausea, vomiting or diarrhea.  No known sick contacts.  Tried ibuprofen prior to arrival.   History reviewed. No pertinent past medical history.  There are no active problems to display for this patient.   History reviewed. No pertinent surgical history.      Home Medications    Prior to Admission medications   Medication Sig Start Date End Date Taking? Authorizing Provider  amoxicillin (AMOXIL) 500 MG capsule Take 1 capsule (500 mg total) by mouth 2 (two) times daily. First dose given in ER tonight. 03/30/18   Ward, Chase PicketJaime Pilcher, PA-C  clindamycin (CLEOCIN) 300 MG capsule Take 1 capsule (300 mg total) by mouth 3 (three) times daily. 10/29/17   Rise MuLeaphart, Kenneth T, PA-C  cyclobenzaprine (FLEXERIL) 10 MG tablet Take 1 tablet (10 mg total) by mouth 2 (two) times daily as needed for muscle spasms. Patient not taking: Reported on 10/16/2017 09/07/17   Roxy HorsemanBrowning, Robert, PA-C  ibuprofen (ADVIL,MOTRIN) 800 MG tablet Take 1 tablet (800 mg total) by mouth every 8 (eight) hours as needed. 03/30/18   Ward, Chase PicketJaime Pilcher, PA-C  lidocaine (XYLOCAINE) 2 % solution Use as directed 20 mLs in the mouth or throat as needed for mouth pain. 10/29/17   Rise MuLeaphart, Kenneth T, PA-C  predniSONE (DELTASONE) 20 MG tablet Take 2  tablets (40 mg total) by mouth daily. Take 40 mg by mouth daily for 3 days, then 20mg  by mouth daily for 3 days, then 10mg  daily for 3 days Patient not taking: Reported on 10/16/2017 09/07/17   Roxy HorsemanBrowning, Robert, PA-C  traMADol (ULTRAM) 50 MG tablet Take 1 tablet (50 mg total) by mouth every 6 (six) hours as needed. Patient not taking: Reported on 10/16/2017 09/07/17   Roxy HorsemanBrowning, Robert, PA-C    Family History History reviewed. No pertinent family history.  Social History Social History   Tobacco Use  . Smoking status: Never Smoker  . Smokeless tobacco: Never Used  Substance Use Topics  . Alcohol use: No    Frequency: Never  . Drug use: No     Allergies   Patient has no known allergies.   Review of Systems Review of Systems  HENT: Positive for sore throat.   All other systems reviewed and are negative.    Physical Exam Updated Vital Signs BP (!) 168/91 (BP Location: Right Arm) Comment: Pt has long sleeve shirt underneath   Pulse (!) 113   Temp 99.8 F (37.7 C) (Oral)   Resp 20   Ht 5\' 8"  (1.727 m)   Wt 83.9 kg   SpO2 98%   BMI 28.13 kg/m   Physical Exam  Constitutional: He is oriented to person, place, and time. He appears well-developed and well-nourished. No distress.  HENT:  Head: Normocephalic and atraumatic.  Oropharynx with erythema, tonsillar hypertrophy and exudates.  Tolerating secretions fine.  Cardiovascular: Normal rate, regular rhythm and normal heart sounds.  No murmur heard. Pulmonary/Chest: Effort normal and breath sounds normal. No respiratory distress.  Lungs clear to auscultation bilaterally.  Abdominal: Soft. He exhibits no distension. There is no tenderness.  Musculoskeletal: He exhibits no edema.  Neurological: He is alert and oriented to person, place, and time.  Skin: Skin is warm and dry.  Nursing note and vitals reviewed.    ED Treatments / Results  Labs (all labs ordered are listed, but only abnormal results are displayed) Labs  Reviewed  GROUP A STREP BY PCR - Abnormal; Notable for the following components:      Result Value   Group A Strep by PCR DETECTED (*)    All other components within normal limits    EKG None  Radiology No results found.  Procedures Procedures (including critical care time)  Medications Ordered in ED Medications  dexamethasone (DECADRON) injection 10 mg (10 mg Intramuscular Given 03/30/18 0306)  ketorolac (TORADOL) 30 MG/ML injection 15 mg (15 mg Intramuscular Given 03/30/18 0306)  lidocaine (XYLOCAINE) 2 % viscous mouth solution 15 mL (15 mLs Mouth/Throat Given 03/30/18 0304)  amoxicillin (AMOXIL) capsule 500 mg (500 mg Oral Given 03/30/18 0310)     Initial Impression / Assessment and Plan / ED Course  I have reviewed the triage vital signs and the nursing notes.  Pertinent labs & imaging results that were available during my care of the patient were reviewed by me and considered in my medical decision making (see chart for details).    Ronnie White is a 52 y.o. male who presents to ED for  sore throat. On exam, patient with tonsillar hypertrophy and exudates. Strep PCR positive. Presentation not concerning for PTA or infection spread to soft tissue. No trismus or uvula deviation. Treated in the ED with steroids, NSAIDs, pain medication. Rx for amoxil, ibuprofen. Specific return precautions discussed. Patient able to drink water and ate apple sauce in ED without difficulty with intact air way. Discussed importance of hydration. Recommended PCP follow up. Return precautions discussed. All questions answered.   Final Clinical Impressions(s) / ED Diagnoses   Final diagnoses:  Strep pharyngitis    ED Discharge Orders         Ordered    amoxicillin (AMOXIL) 500 MG capsule  2 times daily     03/30/18 0357    ibuprofen (ADVIL,MOTRIN) 800 MG tablet  Every 8 hours PRN     03/30/18 0400           Ward, Chase Picket, PA-C 03/30/18 7829    Geoffery Lyons, MD 03/30/18  [phone]

## 2018-03-30 NOTE — Discharge Instructions (Signed)
It was my pleasure taking care of you today!   Please take all of your antibiotics until finished!  Ibuprofen as needed for pain.   Follow up with your doctor in 3-4 days if your symptoms are not improving.  Return to the emergency department immediately if you are unable to swallow, new or worsening symptoms develop or you have any additional concerns.

## 2019-05-13 IMAGING — CT CT NECK W/ CM
4 of 5 series · 14 of 33 positions shown, 16 images · IV contrast (omnipaque)
Comparison: None.

CLINICAL DATA: Sore throat and fever.  Stridor.

EXAM:
CT NECK WITH CONTRAST
TECHNIQUE: Multidetector CT imaging of the neck was performed using the
standard protocol following the bolus administration of intravenous
contrast.
CONTRAST:  75mL OMNIPAQUE IOHEXOL 300 MG/ML  SOLN

[Series 3: axial neck · axial · 0.53mm/px · z∈[-236,-186]mm · 2 of 125 slices shown]
[im 25/125  bone]
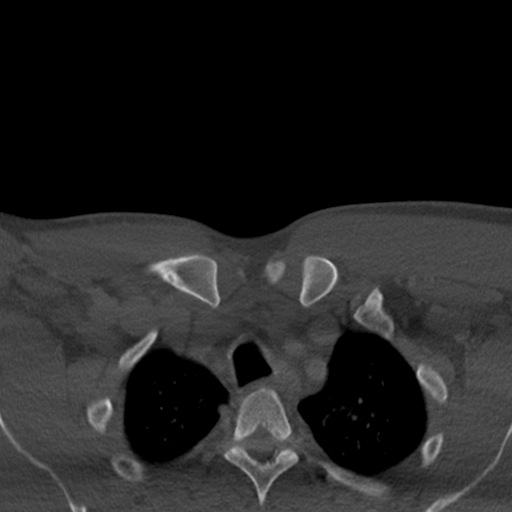
[im 50/125  bone]
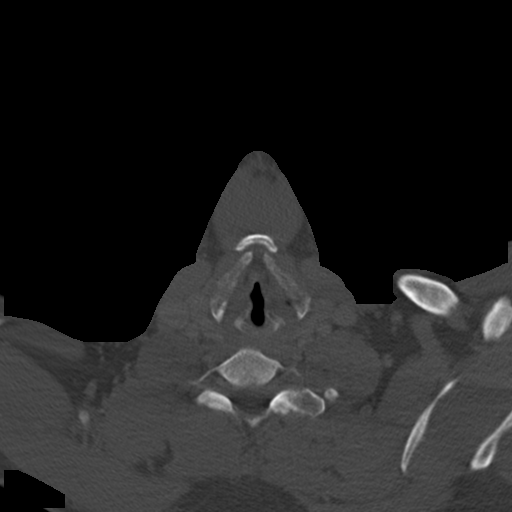

[Series 7: orthogonal ax · axial · 0.39mm/px · z∈[-249,-90]mm · 4 of 134 slices shown, 5 images]
[im 27/134  soft-tissue]
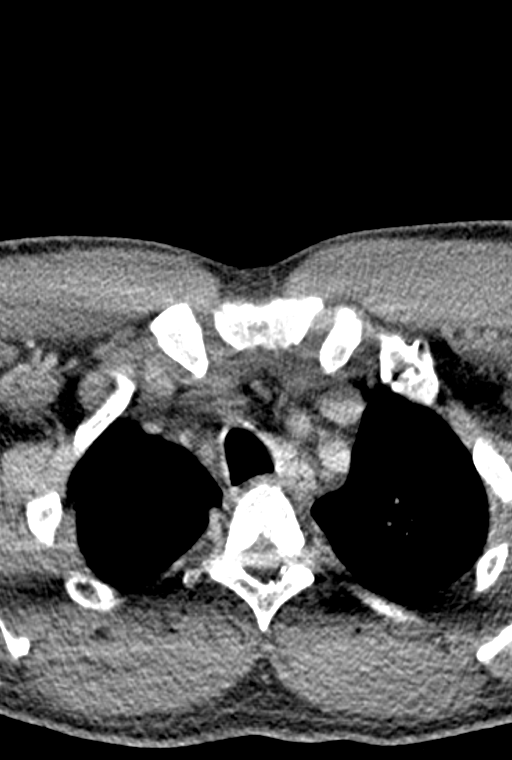
[im 27/134  bone]
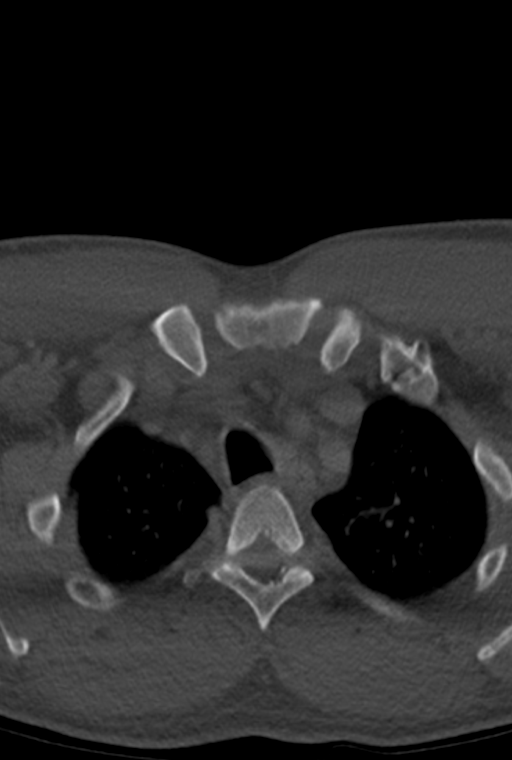
[im 54/134  bone]
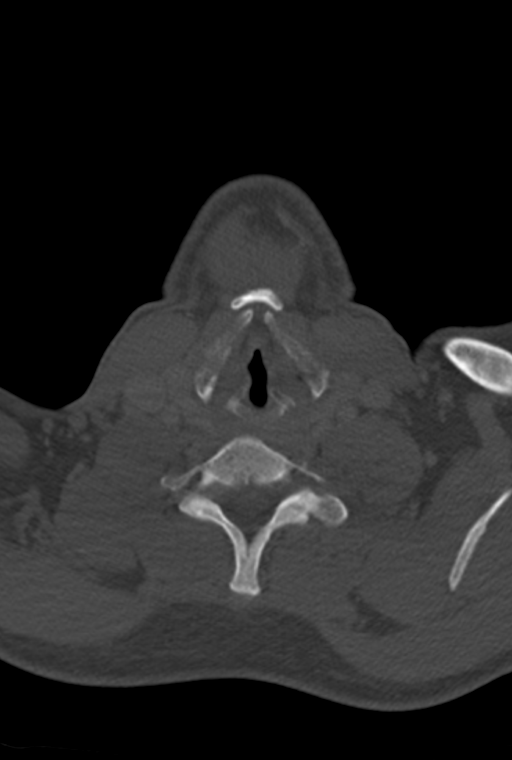
[im 80/134  bone]
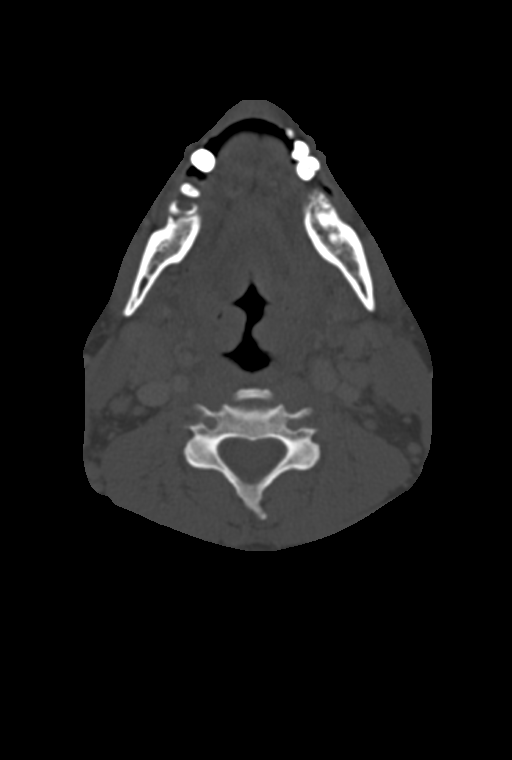
[im 107/134  bone]
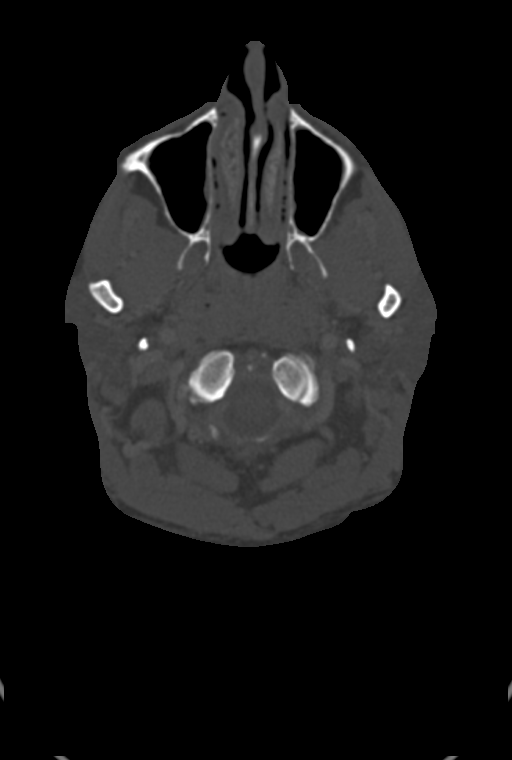

[Series 8: cor neck · coronal · 0.49mm/px · 3 of 116 slices shown]
[im 24/116  bone]
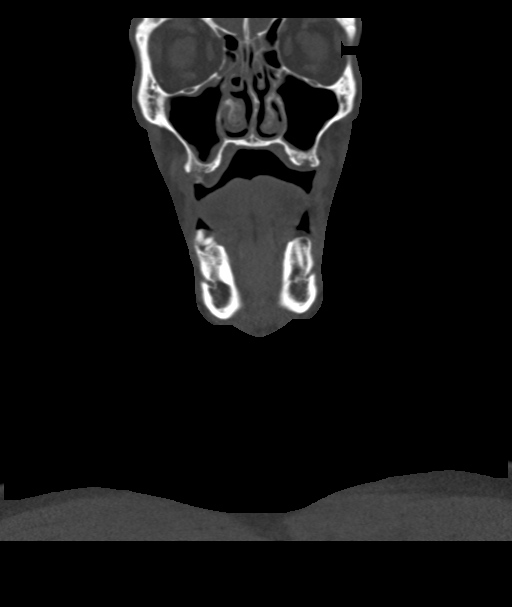
[im 47/116  bone]
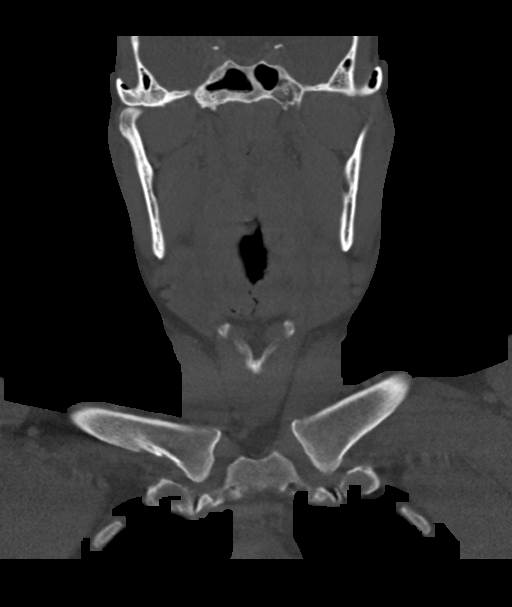
[im 70/116  bone]
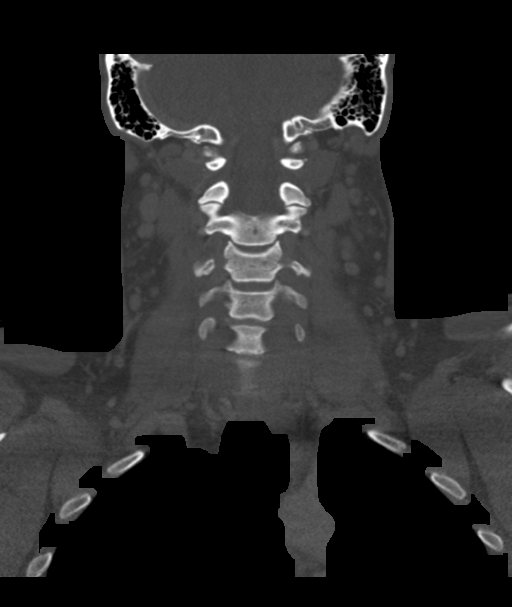

[Series 9: sag neck · sagittal · 0.51mm/px · 5 of 116 slices shown, 6 images]
[im 39/116  bone]
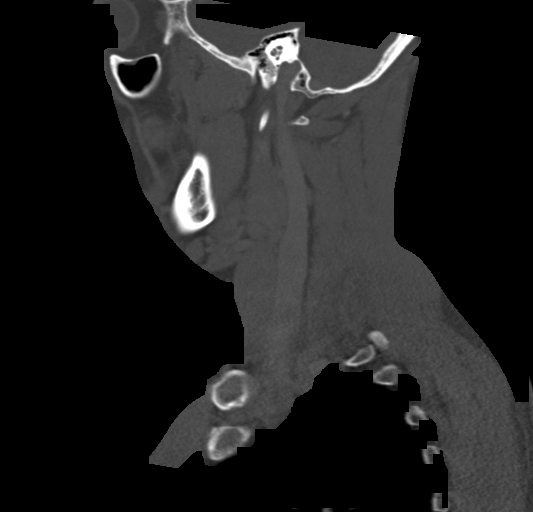
[im 48/116  bone]
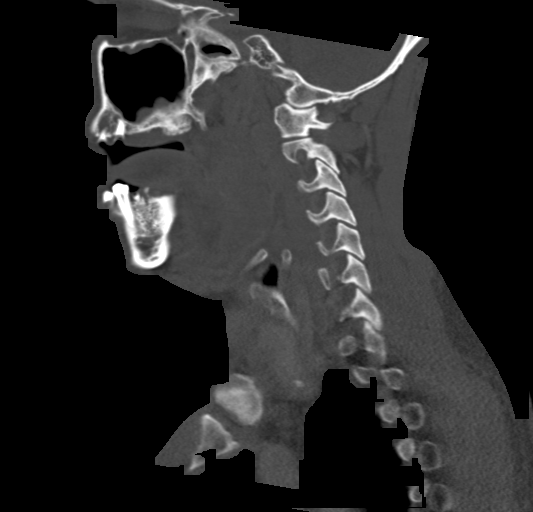
[im 58/116  soft-tissue]
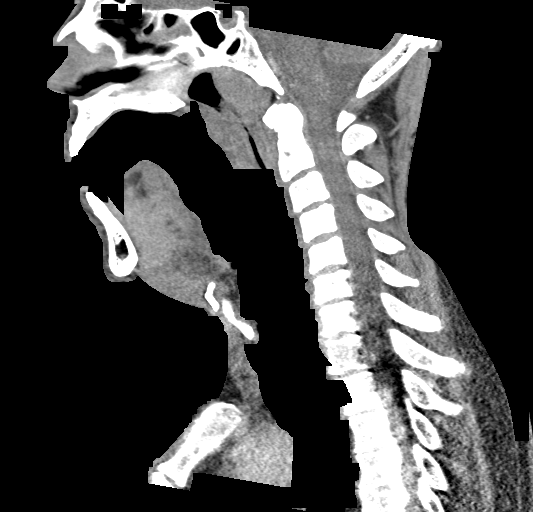
[im 58/116  bone]
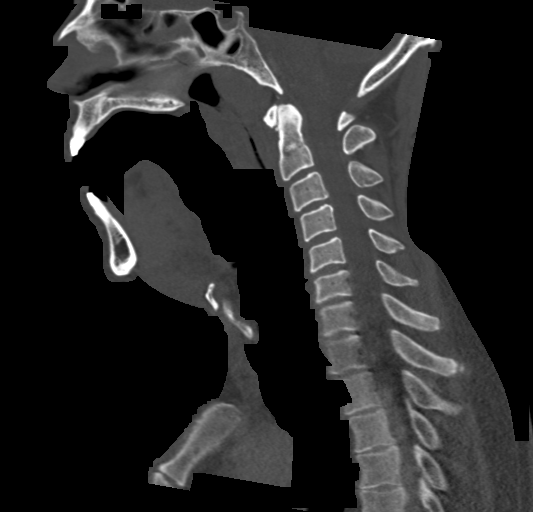
[im 68/116  bone]
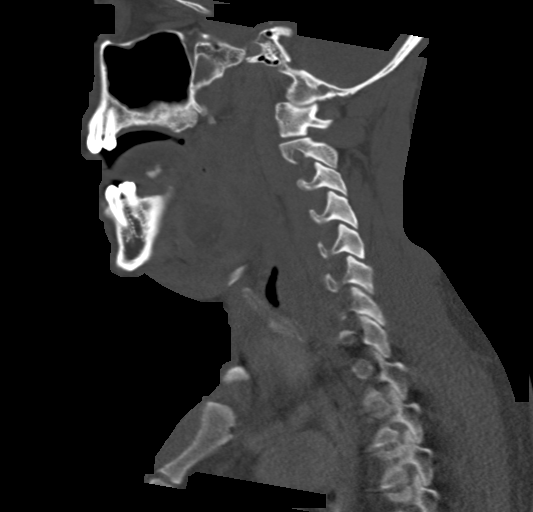
[im 77/116  bone]
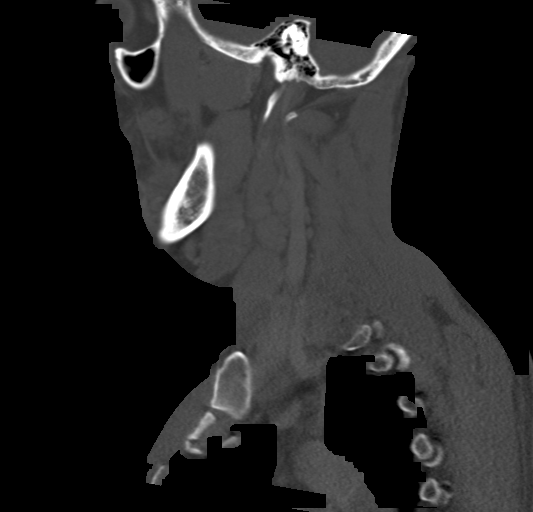

[14 of 33 positions shown; findings below may reference images not displayed]

FINDINGS: Pharynx and larynx: Marked enlargement of the pharyngeal tonsils
bilaterally, left greater than right. Low-density edema in the left
tonsil without well-defined abscess. Adenoid and soft palate
enlargement also bilaterally. Narrowing of the nasopharynx and
oropharynx due to soft tissue swelling. Epiglottis normal. Larynx
normal.

Salivary glands: No inflammation, mass, or stone.

Thyroid: Negative

Lymph nodes: Enlarged lymph nodes in the neck bilaterally. Largest
node on the right is 13 mm level 2 region. Left level 2 node 9 mm.
Multiple small posterior nodes bilaterally. These are likely
reactive due to pharyngitis.

Vascular: Normal arterial and venous enhancement.

Limited intracranial: Negative

Visualized orbits: Negative

Mastoids and visualized paranasal sinuses: Moderate mucosal edema
paranasal sinuses without air-fluid level. Mastoid sinus clear
bilaterally.

Skeleton: Poor dentition with numerous caries. No acute skeletal
lesion.

Upper chest: Negative

Other: None
IMPRESSION: Marked enlargement of the tonsils bilaterally left greater than
right. Soft tissue swelling of the soft palate and adenoid. Findings
most compatible with severe pharyngitis without abscess.
Nasopharyngeal airway narrow. Oropharyngeal airway mildly narrowed.
Negative for epiglottitis.

Cervical enlarged lymph nodes right greater than left most likely
related to pharyngitis.

## 2022-08-21 ENCOUNTER — Emergency Department (HOSPITAL_COMMUNITY)
Admission: EM | Admit: 2022-08-21 | Discharge: 2022-08-21 | Disposition: A | Discharge status: Home / Self Care | Payer: Self-pay | Attending: Emergency Medicine | Admitting: Emergency Medicine

## 2022-08-21 ENCOUNTER — Encounter (HOSPITAL_COMMUNITY): Payer: Self-pay

## 2022-08-21 ENCOUNTER — Ambulatory Visit
Admission: RE | Admit: 2022-08-21 | Discharge: 2022-08-21 | Disposition: A | Discharge status: Other Acute Inpatient Hospital | Payer: Self-pay | Source: Ambulatory Visit

## 2022-08-21 VITALS — BP 169/96 | HR 93 | Temp 97.9°F | Resp 16

## 2022-08-21 DIAGNOSIS — J36 Peritonsillar abscess: Secondary | ICD-10-CM

## 2022-08-21 DIAGNOSIS — J029 Acute pharyngitis, unspecified: Secondary | ICD-10-CM

## 2022-08-21 DIAGNOSIS — K1379 Other lesions of oral mucosa: Secondary | ICD-10-CM

## 2022-08-21 LAB — BASIC METABOLIC PANEL
Anion gap: 11 (ref 5–15)
BUN: 9 mg/dL (ref 6–20)
CO2: 25 mmol/L (ref 22–32)
Calcium: 9.4 mg/dL (ref 8.9–10.3)
Chloride: 103 mmol/L (ref 98–111)
Creatinine, Ser: 0.93 mg/dL (ref 0.61–1.24)
GFR, Estimated: >60 mL/min (ref >60)
Glucose, Bld: 174 mg/dL — ABNORMAL HIGH (ref 70–99)
Potassium: 4 mmol/L (ref 3.5–5.1)
Sodium: 139 mmol/L (ref 135–145)

## 2022-08-21 LAB — CBC
HCT: 39.9 % (ref 39.0–52.0)
Hemoglobin: 12.7 g/dL — ABNORMAL LOW (ref 13.0–17.0)
MCH: 27.9 pg (ref 26.0–34.0)
MCHC: 31.8 g/dL (ref 30.0–36.0)
MCV: 87.5 fL (ref 80.0–100.0)
Platelets: 355 10*3/uL (ref 150–400)
RBC: 4.56 MIL/uL (ref 4.22–5.81)
RDW: 14 % (ref 11.5–15.5)
WBC: 13.9 10*3/uL — ABNORMAL HIGH (ref 4.0–10.5)
nRBC: 0 % (ref 0.0–0.2)

## 2022-08-21 LAB — GROUP A STREP BY PCR: Group A Strep by PCR: NOT DETECTED

## 2022-08-21 MED ORDER — AMPICILLIN-SULBACTAM SODIUM 3 (2-1) G IJ SOLR
3.0000 g | Freq: Once | INTRAMUSCULAR | Status: AC
  Administered 2022-08-21: 3 g via INTRAVENOUS
  Filled 2022-08-21: qty 8

## 2022-08-21 MED ORDER — DEXAMETHASONE SODIUM PHOSPHATE 10 MG/ML IJ SOLN
10.0000 mg | Freq: Once | INTRAMUSCULAR | Status: AC
  Administered 2022-08-21: 10 mg via INTRAVENOUS
  Filled 2022-08-21: qty 1

## 2022-08-21 MED ORDER — CLINDAMYCIN HCL 150 MG PO CAPS: 300.0000 mg | ORAL_CAPSULE | Freq: Three times a day (TID) | ORAL | 0 refills | Status: AC

## 2022-08-21 NOTE — Discharge Instructions (Addendum)
As we discussed, you do have a peritonsillar abscess which is causing your pain.  We have given you 1 dose of IV antibiotics as well as steroids to help with the symptoms.  You will also need to fill the prescription placed for antibiotics and take the medication in its entirety.  You may also take Tylenol/ibuprofen as needed for pain and fevers.  I also recommend that you call the ear nose and throat doctor on Monday to schedule an appointment for continued evaluation and management.  If your symptoms do not get better or worsen, if you are not able to swallow or tolerate your own secretions, you will need to return to the emergency department for additional management.  Return if development of any new or worsening symptoms.

## 2022-08-21 NOTE — ED Triage Notes (Signed)
Pt presents to [facility] with co of sore throat and swollen tonsils. Pt reports this started this week but has progressed worse.

## 2022-08-21 NOTE — Discharge Instructions (Signed)
Go straight to the emergency department for further evaluation management as I am concerned for peritonsillar abscess.

## 2022-08-21 NOTE — ED Notes (Signed)
Patient is being discharged from the Urgent Care and sent to the Emergency Department via pov . Per [place] np, patient is in need of higher level of care due to peritonsillar abscess. Patient is aware and verbalizes understanding of plan of care.  Vitals:   08/21/22 0918 08/21/22 0929  BP: (!) 169/96   Pulse: 93   Resp: 16   Temp: 97.9 F (36.6 C)   SpO2: 93% 95%

## 2022-08-21 NOTE — ED Triage Notes (Signed)
Pt presents with c/o sore throat that started earlier this week. Pt went to UC this morning and was sent here for a possible peritonsillar abscess.

## 2022-08-21 NOTE — ED Provider Notes (Signed)
[place] EMERGENCY DEPARTMENT AT [facility] Provider Note   CSN: [id number] Arrival date & time: 08/21/22  1100     History  Chief Complaint  Patient presents with   Sore Throat    Ronnie White is a 57 y.o. male.  Patient with no pertinent past medical history presents today with complaints of sore throat. He states that same began initially 6 days ago and has been persistent since. He states that his pain and difficulty swallowing were worst 2 days ago and have been getting some better on their own but have not completely subsided. He has associated hoarseness. He is able to swallow and tolerate secretions. He denies fevers, chills, cough, congestion, chest pain, shortness of breath, nausea, or vomiting.  The history is provided by the patient. No language interpreter was used.  Sore Throat       Home Medications Prior to Admission medications   Medication Sig Start Date End Date Taking? Authorizing Provider  amoxicillin (AMOXIL) 500 MG capsule Take 1 capsule (500 mg total) by mouth 2 (two) times daily. First dose given in ER tonight. 03/30/18   Ward, Ozella Almond, PA-C  clindamycin (CLEOCIN) 300 MG capsule Take 1 capsule (300 mg total) by mouth 3 (three) times daily. 10/29/17   Doristine Devoid, PA-C  cyclobenzaprine (FLEXERIL) 10 MG tablet Take 1 tablet (10 mg total) by mouth 2 (two) times daily as needed for muscle spasms. Patient not taking: Reported on 10/16/2017 09/07/17   Montine Circle, PA-C  ibuprofen (ADVIL,MOTRIN) 800 MG tablet Take 1 tablet (800 mg total) by mouth every 8 (eight) hours as needed. 03/30/18   Ward, Ozella Almond, PA-C  lidocaine (XYLOCAINE) 2 % solution Use as directed 20 mLs in the mouth or throat as needed for mouth pain. 10/29/17   Doristine Devoid, PA-C  predniSONE (DELTASONE) 20 MG tablet Take 2 tablets (40 mg total) by mouth daily. Take 40 mg by mouth daily for 3 days, then 77m by mouth daily for 3 days, then 153mdaily for 3  days Patient not taking: Reported on 10/16/2017 09/07/17   BrMontine CirclePA-C  traMADol (ULTRAM) 50 MG tablet Take 1 tablet (50 mg total) by mouth every 6 (six) hours as needed. Patient not taking: Reported on 10/16/2017 09/07/17   BrMontine CirclePA-C      Allergies    Patient has no known allergies.    Review of Systems   Review of Systems  HENT:  Positive for sore throat.   All other systems reviewed and are negative.   Physical Exam Updated Vital Signs BP (!) 150/116 (BP Location: Left Arm)   Pulse 91   Temp 97.9 F (36.6 C) (Oral)   Resp 18   SpO2 98%  Physical Exam Vitals and nursing note reviewed.  Constitutional:      General: He is not in acute distress.    Appearance: Normal appearance. He is normal weight. He is not ill-appearing, toxic-appearing or diaphoretic.  HENT:     Head: Normocephalic and atraumatic.     Mouth/Throat:     Tonsils: Tonsillar exudate and tonsillar abscess present.     Comments: Obvious swelling of the peritonsillar space on the right side. Airway patent Cardiovascular:     Rate and Rhythm: Normal rate.  Pulmonary:     Effort: Pulmonary effort is normal. No respiratory distress.     Breath sounds: Normal breath sounds.  Abdominal:     Palpations: Abdomen is soft.  Musculoskeletal:  General: Normal range of motion.     Cervical back: Normal range of motion.  Skin:    General: Skin is warm and dry.  Neurological:     General: No focal deficit present.     Mental Status: He is alert.  Psychiatric:        Mood and Affect: Mood normal.        Behavior: Behavior normal.     ED Results / Procedures / Treatments   Labs (all labs ordered are listed, but only abnormal results are displayed) Labs Reviewed  CBC - Abnormal; Notable for the following components:      Result Value   WBC 13.9 (*)    Hemoglobin 12.7 (*)    All other components within normal limits  BASIC METABOLIC PANEL - Abnormal; Notable for the following  components:   Glucose, Bld 174 (*)    All other components within normal limits  GROUP A STREP BY PCR    EKG None  Radiology No results found.  Procedures Procedures    Medications Ordered in ED Medications  Ampicillin-Sulbactam (UNASYN) 3 g in sodium chloride 0.9 % 100 mL IVPB (0 g Intravenous Stopped 08/21/22 1211)  dexamethasone (DECADRON) injection 10 mg (10 mg Intravenous Given 08/21/22 1146)    ED Course/ Medical Decision Making/ A&P                             Medical Decision Making Amount and/or Complexity of Data Reviewed Labs: ordered.  Risk Prescription drug management.   This patient is a 57 y.o. male who presents to the ED for concern of sore throat, this involves an extensive number of treatment options, and is a complaint that carries with it a high risk of complications and morbidity. The emergent differential diagnosis prior to evaluation includes, but is not limited to,  PTA/RPA, strep throat . This is not an exhaustive differential.   Past Medical History / Co-morbidities / Social History: N/A  Additional history: Chart reviewed. Pertinent results include: sent from urgent care with concern for PTA  Physical Exam: Physical exam performed. The pertinent findings include: obvious PTA on right side. Tolerating secretions and airway patent  Lab Tests: I ordered, and personally interpreted labs.  The pertinent results include:  WBC 13.9, hgb 12.7. Glucose 174.   Medications: I ordered medication including decadron and unasyn  for peritonsillar abscess. Reevaluation of the patient after these medicines showed that the patient improved. I have reviewed the patients home medicines and have made adjustments as needed.  Consultations Obtained: I requested consultation with the ENT on call Dr. Constance Holster,  and discussed lab and imaging findings as well as pertinent plan - they recommend: d/c with po clindamycin and ENT follow-up on Monday     Disposition:  Patient presents today with concerns for sore throat. Physical exam reveals obvious peritonsillar abscess. Patient is tolerating secretions. Discussed with ENT who requested discharge with close outpatient follow-up. Given that he is tolerating secretions, his airway is patent, and his symptoms have reportedly been improving, feel this is reasonable. Did give 1 dose of IV Unasyn with decadron which patient states did improve his symptoms. He is also afebrile, nontoxic-appearing, and in no acute distress with reassuring vital signs. No indication for CT imaging at this time. He is stable for discharge at this time. Close return precautions recommended.  He is understanding and amenable with plan, educated on red flag symptoms that would  prompt immediate return.  Patient discharged in stable condition.  Findings and plan of care discussed with supervising physician Dr. Francia Greaves who is in agreement.    Final Clinical Impression(s) / ED Diagnoses Final diagnoses:  Peritonsillar abscess    Rx / DC Orders ED Discharge Orders          Ordered    clindamycin (CLEOCIN) 150 MG capsule  3 times daily        08/21/22 1248          An After Visit Summary was printed and given to the patient.     Nestor Lewandowsky 08/21/22 1252    Valarie Merino, MD 08/22/22 [phone]

## 2022-08-21 NOTE — ED Provider Notes (Signed)
[facility]    CSN: [id number] Arrival date & time: 08/21/22  0857      History   Chief Complaint Chief Complaint  Patient presents with   Sore Throat    HPI Ronnie White is a 57 y.o. male.   Patient presents with sore throat and concerns for swollen tonsils that started about 5 to 6 days ago.  Patient denies any associated upper respiratory symptoms, cough, fever at this time.  He does report that he had cold-like symptoms a few weeks prior but those symptoms resolved.  Denies any known sick contacts.  Reports that he does have some hoarseness as well.  Has taken Advil and over-the-counter medications with minimal improvement in symptoms.  Denies chest pain or shortness of breath.   Sore Throat    History reviewed. No pertinent past medical history.  There are no problems to display for this patient.   History reviewed. No pertinent surgical history.     Home Medications    Prior to Admission medications   Medication Sig Start Date End Date Taking? Authorizing Provider  amoxicillin (AMOXIL) 500 MG capsule Take 1 capsule (500 mg total) by mouth 2 (two) times daily. First dose given in ER tonight. 03/30/18   Ward, Ozella Almond, PA-C  clindamycin (CLEOCIN) 300 MG capsule Take 1 capsule (300 mg total) by mouth 3 (three) times daily. 10/29/17   Doristine Devoid, PA-C  cyclobenzaprine (FLEXERIL) 10 MG tablet Take 1 tablet (10 mg total) by mouth 2 (two) times daily as needed for muscle spasms. Patient not taking: Reported on 10/16/2017 09/07/17   Montine Circle, PA-C  ibuprofen (ADVIL,MOTRIN) 800 MG tablet Take 1 tablet (800 mg total) by mouth every 8 (eight) hours as needed. 03/30/18   Ward, Ozella Almond, PA-C  lidocaine (XYLOCAINE) 2 % solution Use as directed 20 mLs in the mouth or throat as needed for mouth pain. 10/29/17   Doristine Devoid, PA-C  predniSONE (DELTASONE) 20 MG tablet Take 2 tablets (40 mg total) by mouth daily. Take 40 mg by mouth daily  for 3 days, then 96m by mouth daily for 3 days, then 126mdaily for 3 days Patient not taking: Reported on 10/16/2017 09/07/17   BrMontine CirclePA-C  traMADol (ULTRAM) 50 MG tablet Take 1 tablet (50 mg total) by mouth every 6 (six) hours as needed. Patient not taking: Reported on 10/16/2017 09/07/17   BrMontine CirclePA-C    Family History History reviewed. No pertinent family history.  Social History Social History   Tobacco Use   Smoking status: Every Day    Packs/day: 0.50    Types: Cigarettes   Smokeless tobacco: Never  Substance Use Topics   Alcohol use: No   Drug use: No     Allergies   Patient has no known allergies.   Review of Systems Review of Systems Per HPI  Physical Exam Triage Vital Signs ED Triage Vitals  Enc Vitals Group     BP 08/21/22 0918 (!) 169/96     Pulse Rate 08/21/22 0918 93     Resp 08/21/22 0918 16     Temp 08/21/22 0918 97.9 F (36.6 C)     Temp Source 08/21/22 0918 Oral     SpO2 08/21/22 0918 93 %     Weight --      Height --      Head Circumference --      Peak Flow --      Pain Score 08/21/22  [id number]     Pain Loc --      Pain Edu? --      Excl. in [place]? --    No data found.  Updated Vital Signs BP (!) 169/96   Pulse 93   Temp 97.9 F (36.6 C) (Oral)   Resp 16   SpO2 95%   Visual Acuity Right Eye Distance:   Left Eye Distance:   Bilateral Distance:    Right Eye Near:   Left Eye Near:    Bilateral Near:     Physical Exam Constitutional:      General: He is not in acute distress.    Appearance: Normal appearance. He is not toxic-appearing or diaphoretic.  HENT:     Head: Normocephalic and atraumatic.     Mouth/Throat:     Tonsils: Tonsillar abscess present. No tonsillar exudate.     Comments: Uvula deviation slightly to the left.  Hoarseness notable for "hot potato voice". Eyes:     Extraocular Movements: Extraocular movements intact.     Conjunctiva/sclera: Conjunctivae normal.  Cardiovascular:     Rate and  Rhythm: Normal rate and regular rhythm.     Pulses: Normal pulses.     Heart sounds: Normal heart sounds.  Pulmonary:     Effort: Pulmonary effort is normal. No respiratory distress.     Breath sounds: Normal breath sounds. No stridor. No wheezing, rhonchi or rales.  Neurological:     General: No focal deficit present.     Mental Status: He is alert and oriented to person, place, and time. Mental status is at baseline.  Psychiatric:        Mood and Affect: Mood normal.        Behavior: Behavior normal.        Thought Content: Thought content normal.        Judgment: Judgment normal.      UC Treatments / Results  Labs (all labs ordered are listed, but only abnormal results are displayed) Labs Reviewed - No data to display  EKG   Radiology No results found.  Procedures Procedures (including critical care time)  Medications Ordered in UC Medications - No data to display  Initial Impression / Assessment and Plan / UC Course  I have reviewed the triage vital signs and the nursing notes.  Pertinent labs & imaging results that were available during my care of the patient were reviewed by me and considered in my medical decision making (see chart for details).     Patient's physical exam is very concerning for peritonsillar abscess on the right side.  Patient's oxygen is ranging from 92 to 95%.  He also has significant hoarseness.  Therefore, advised patient that he will need to go to the ER for further evaluation and management.  Patient was agreeable with plan.  Suggested EMS transport but he declined this.  Risks associated with not going by EMS were discussed with patient.  Patient voiced understanding and accepted risks.  He wished for self transport and left via self transport to go to the ER. Final Clinical Impressions(s) / UC Diagnoses   Final diagnoses:  Peritonsillar abscess  Deviation of uvula to left  Sore throat     Discharge Instructions      Go straight  to the emergency department for further evaluation management as I am concerned for peritonsillar abscess.    ED Prescriptions   None    PDMP not reviewed this encounter.   [address] [place],  FNP 08/21/22 [phone]
